# Patient Record
Sex: Male | Born: 1960 | Race: Black or African American | Hispanic: No | Marital: Single
Health system: Midwestern US, Community
[De-identification: ages and names within clinical notes are randomized; demographics above are authoritative.]

---

## 1998-05-02 ENCOUNTER — Encounter: Payer: Self-pay | Admitting: Emergency Medicine

## 1998-05-02 ENCOUNTER — Emergency Department (HOSPITAL_COMMUNITY): Admission: EM | Admit: 1998-05-02 | Discharge: 1998-05-02 | Payer: Self-pay | Admitting: Emergency Medicine

## 2002-06-18 ENCOUNTER — Emergency Department (HOSPITAL_COMMUNITY): Admission: EM | Admit: 2002-06-18 | Discharge: 2002-06-19 | Payer: Self-pay | Admitting: Internal Medicine

## 2002-06-19 ENCOUNTER — Encounter: Payer: Self-pay | Admitting: Internal Medicine

## 2002-07-14 ENCOUNTER — Emergency Department (HOSPITAL_COMMUNITY): Admission: EM | Admit: 2002-07-14 | Discharge: 2002-07-14 | Payer: Self-pay | Admitting: Emergency Medicine

## 2003-07-21 ENCOUNTER — Encounter: Payer: Self-pay | Admitting: *Deleted

## 2003-07-21 ENCOUNTER — Inpatient Hospital Stay (HOSPITAL_COMMUNITY): Admission: EM | Admit: 2003-07-21 | Discharge: 2003-07-28 | Payer: Self-pay | Admitting: Psychiatry

## 2003-09-22 ENCOUNTER — Inpatient Hospital Stay (HOSPITAL_COMMUNITY): Admission: EM | Admit: 2003-09-22 | Discharge: 2003-09-26 | Payer: Self-pay | Admitting: Psychiatry

## 2003-10-23 ENCOUNTER — Encounter: Payer: Self-pay | Admitting: *Deleted

## 2003-10-23 ENCOUNTER — Inpatient Hospital Stay (HOSPITAL_COMMUNITY): Admission: RE | Admit: 2003-10-23 | Discharge: 2003-10-26 | Payer: Self-pay | Admitting: Psychiatry

## 2003-11-27 NOTE — Unmapped (Signed)
Signed by   LinkLogic on 02/21/2004 at 14:28:37  Patient: [name]  Note: All result statuses are Final unless otherwise noted.    Tests: (1)  (MR)    Order Note:                                  [facility]     PATIENT NAME:         Troy Jennings, Troy Jennings                   MR #:  [id number]  DATE OF BIRTH:        1961/05/22                      ACCOUNT #:  [id number]  PHYSICAN:             Creig Hines, M.D.               ROOM #:  CVR  SERVICE:              Internal Medicine/Cardiology    NURSING UNIT:  UCV  DICTATED BY:          Creig Hines, M.D.               [facility]:  C  PROCEDURE DATE:       11/27/2003                      DISCHARGE DATE:                                        PROCEDURE REPORT     INDICATIONS:  The patient is a 43 year old man with limited scleroderma and  recent onset of exertional dyspnea with suspicion of pulmonary arterial  hypertension.     PROCEDURE(S) PERFORMED:       1.  Right heart catheterization via the right femoral vein.     DETAILS OF PROCEDURE:  The patient was prepped and draped in the usual  manner for femoral venous approach to right heart catheterization after  informed consent was obtained.  Local anesthesia was obtained with 2%  Xylocaine, and a small incision was made in the skin at the site of local  anesthesia.  Access to the right femoral vein was obtained using a single-  entry needle, and a Cordis sheath/introducer combination was inserted over  a wire into the right femoral vein.  The dilator was removed, and the  sheath left in place and flushed.  A 7 French Swan-Ganz catheter was  advanced through the Cordis sheath into the femoral vein and from there to  the inferior vena cava, the right atrium, the right ventricle, the  pulmonary artery, and the pulmonary artery wedge positions in that order,  and blood samples were obtained at each site in sequence for O2 saturation  determination.  Pressures measurements were made as the catheter was  advanced.  With the  catheter in the main pulmonary artery, thermodilution  cardiac output measurements were made at least triplicate.  The Swan-Ganz  catheter was then removed, and the Cordis sheath introducer was removed as  well.  Manual pressure was maintained over the puncture site for five  minutes.  Once hemostasis was obtained, the patient was removed from the  cardiac catheterization laboratory in good condition.       FINDINGS:     1.        The following intracardiac pressures were noted:  Right atrium 4,            right ventricle 23/6, pulmonary artery 23/10, pulmonary artery mean            13, pulmonary artery wedge 7.  The right atrial O2 saturation was   68%,            the pulmonary artery O2 saturation 70%.  The cardiac output was 6.7            L/min, the cardiac index 3.0 L/min/m2.  The pulmonary vascular            resistance was less than 1 Wood unit.        IMPRESSION:     1.        Normal right heart catheterization without evidence of pulmonary            arterial hypertension or left ventricular diastolic dysfunction.                                                                                                           PE/mjs  D:  12/04/2003  T:  12/04/2003     c:  Creig Hines, M.D.         [place], M.D.         [place], M.D.*       Note: An exclamation mark (!) indicates a result that was not dispersed into   the flowsheet.  Document Creation Date: 02/21/2004 2:28 PM  _______________________________________________________________________    (1) Order result status: Final  Collection or observation date-time: 11/27/2003 00:00  Requested date-time:   Receipt date-time:   Reported date-time:   Referring Physician:    Ordering Physician:  Reviewed In Hospital [facility])  Specimen Source:   Source: DBS  Filler Order Number: [id number]  Lab site:

## 2003-12-18 NOTE — Unmapped (Signed)
Signed by   LinkLogic on 12/18/2003 at 13:49:16    Appointment status changed to no show by  LinkLogic on 12/18/2003 1:49 PM.    No Show Comments  ----------------  OTHER     Appointment Information  -----------------------  Appt Type:         Date:  Monday, Dec 18, 2003       Time:  1:00 PM for 30 min    Urgency:  Routine    Made By:  Jannett Celestine   To Visit:  Jim Like MD     Reason:  OTHER     Appt Comments  -------------  -- 12/18/03 13:49: Gerrit Heck) NO SHOW --  OTHER   DR. Georgeann Oppenheim    -- 11/20/03 16:07: (POLLITJA) BOOKED --  Routine  at 12/18/2003 1:00 PM for 30 min  OTHER   DR. Georgeann Oppenheim

## 2004-02-07 NOTE — Unmapped (Signed)
Signed by   LinkLogic on 02/07/2004 at 14:44:09    Appointment status changed to no show by  LinkLogic on 02/07/2004 2:44 PM.    No Show Comments  ----------------  OTHER     Appointment Information  -----------------------  Appt Type:         Date:  Wednesday, February 07, 2004       Time:  1:45 PM for 15 min    Urgency:  Routine    Made By:  Jannett Celestine   To Visit:  Aleen Sells MD     Reason:  OTHER     Appt Comments  -------------  -- 02/07/04 14:44: (HOWARDNL) NO SHOW --  OTHER   NO TEST ORDERED    -- 11/01/03 15:46: [facility]) BOOKED --  Routine  at 02/07/2004 1:45 PM for 15 min  OTHER   NO TEST ORDERED

## 2004-04-28 ENCOUNTER — Ambulatory Visit: Payer: Self-pay | Admitting: Psychiatry

## 2004-04-29 ENCOUNTER — Inpatient Hospital Stay (HOSPITAL_COMMUNITY): Admission: EM | Admit: 2004-04-29 | Discharge: 2004-05-03 | Payer: Self-pay | Admitting: Psychiatry

## 2004-05-04 ENCOUNTER — Emergency Department (HOSPITAL_COMMUNITY): Admission: EM | Admit: 2004-05-04 | Discharge: 2004-05-04 | Payer: Self-pay | Admitting: Emergency Medicine

## 2004-12-15 ENCOUNTER — Emergency Department (HOSPITAL_COMMUNITY): Admission: EM | Admit: 2004-12-15 | Discharge: 2004-12-15 | Payer: Self-pay | Admitting: *Deleted

## 2005-01-04 IMAGING — CT CT HEAD W/O CM
1 series · 16 of 30 positions shown, 20 images · non-contrast
Comparison: none

CLINICAL DATA: Headache.  Dizziness.

[Series 2: head routi 5.0 h30s · axial · 0.43mm/px · z∈[-652,-518]mm · 16 of 30 slices shown, 20 images]
[im 2/30  brain]
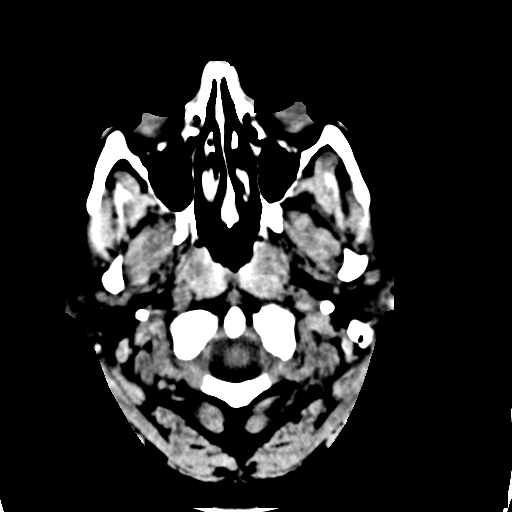
[im 2/30  bone]
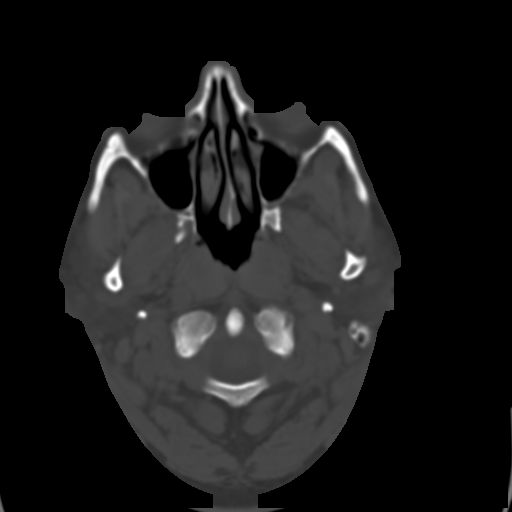
[im 4/30  brain]
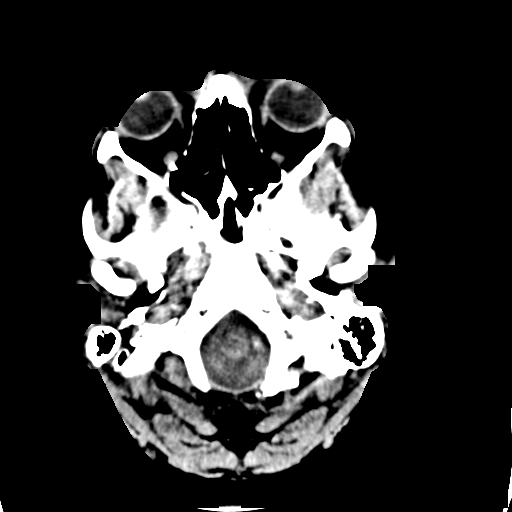
[im 6/30  brain]
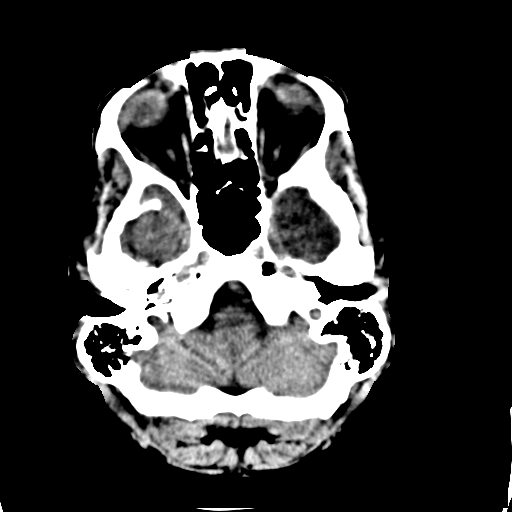
[im 8/30  brain]
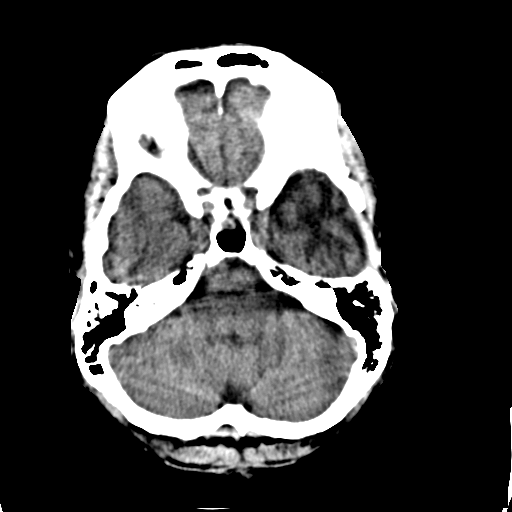
[im 9/30  brain]
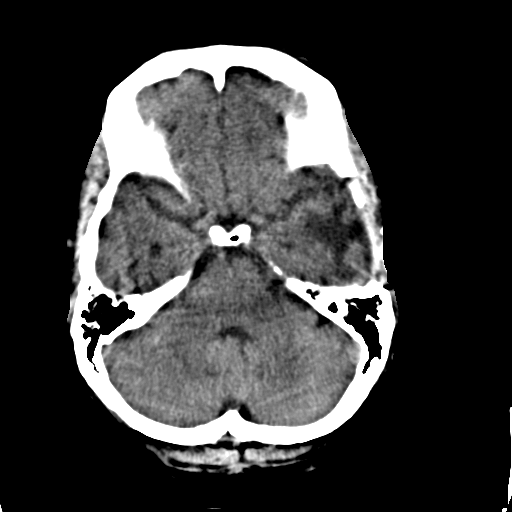
[im 9/30  bone]
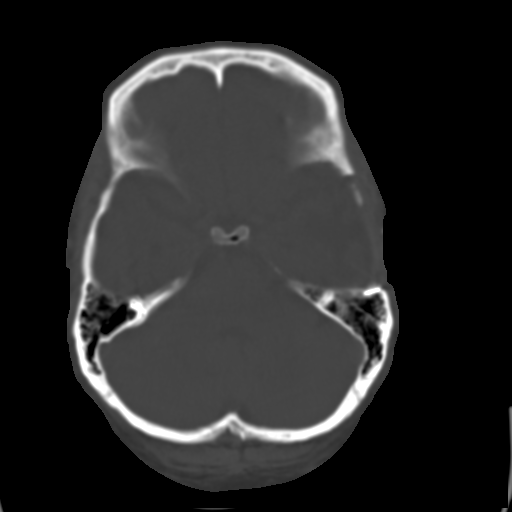
[im 11/30  brain]
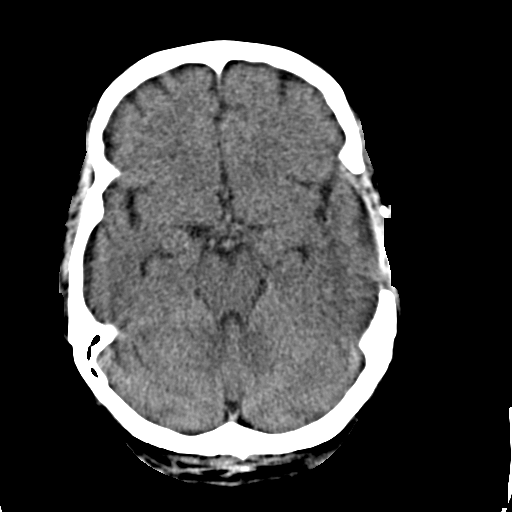
[im 13/30  brain]
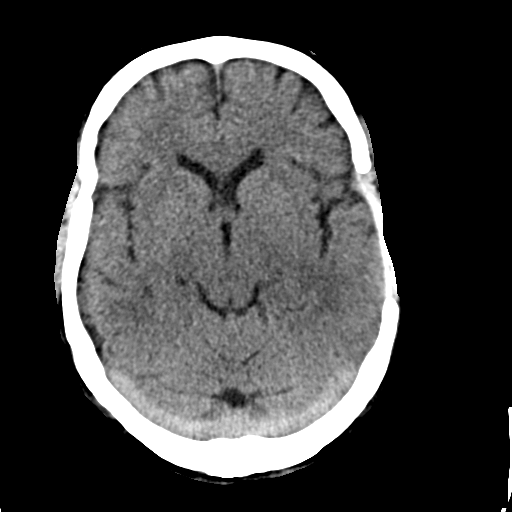
[im 15/30  brain]
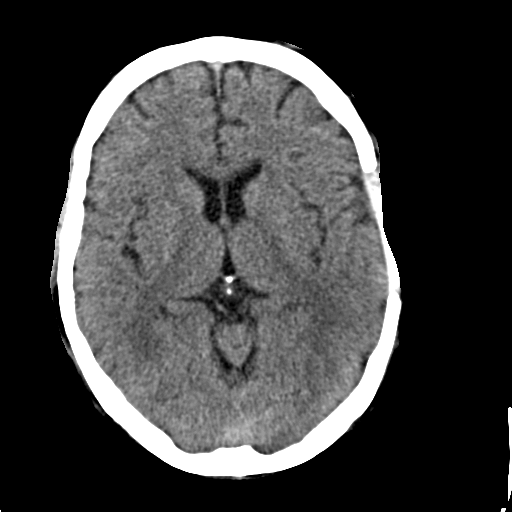
[im 16/30  brain]
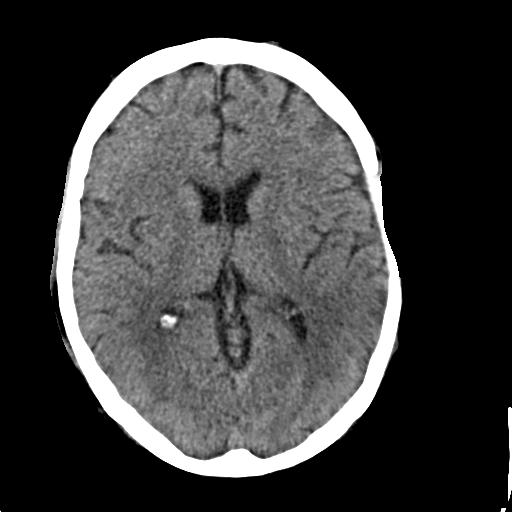
[im 16/30  bone]
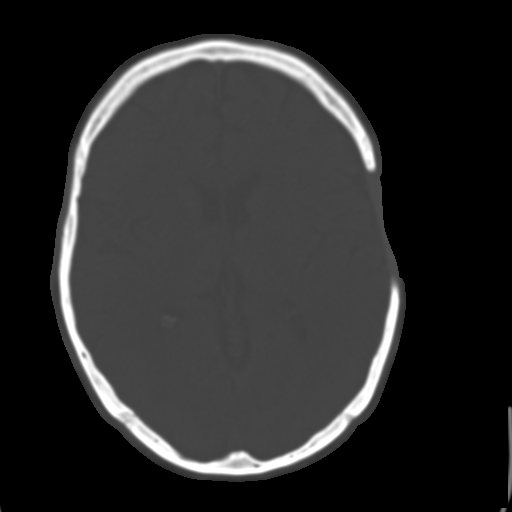
[im 18/30  brain]
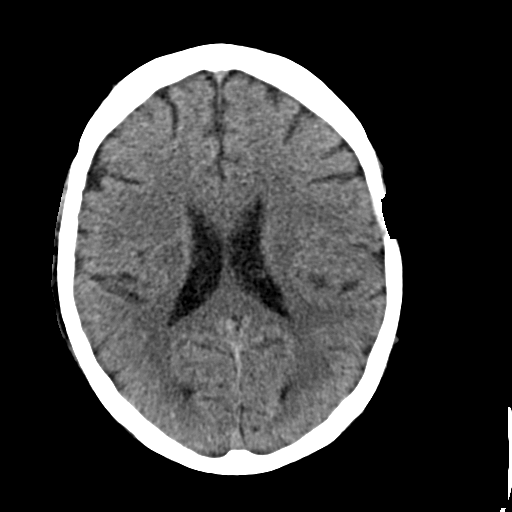
[im 20/30  brain]
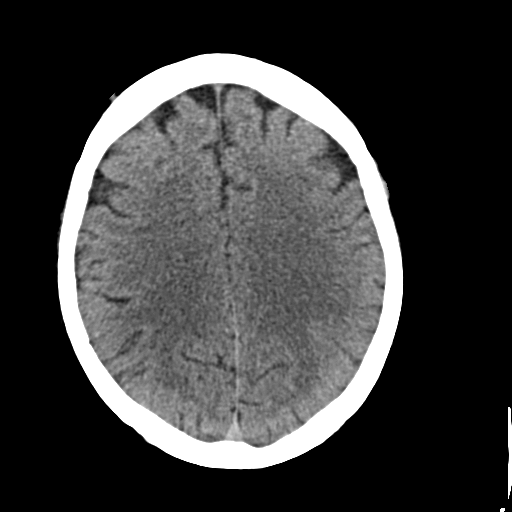
[im 22/30  brain]
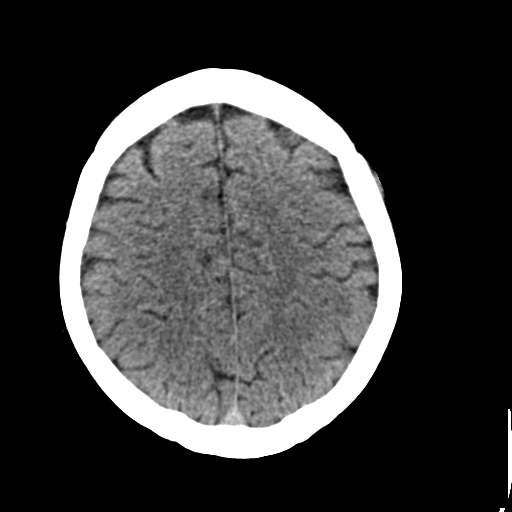
[im 23/30  brain]
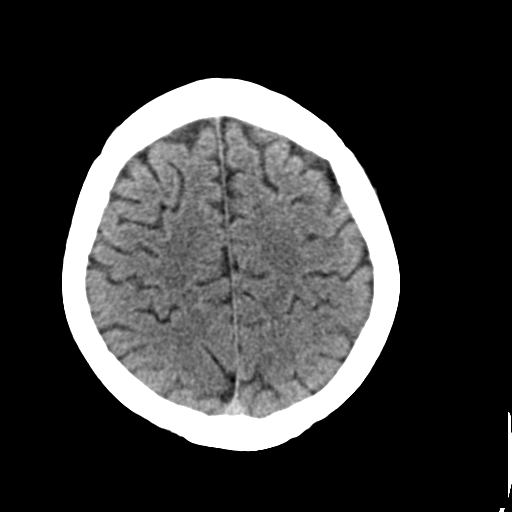
[im 23/30  bone]
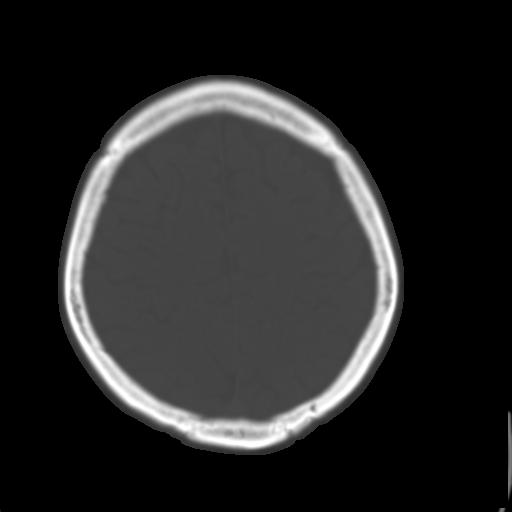
[im 25/30  brain]
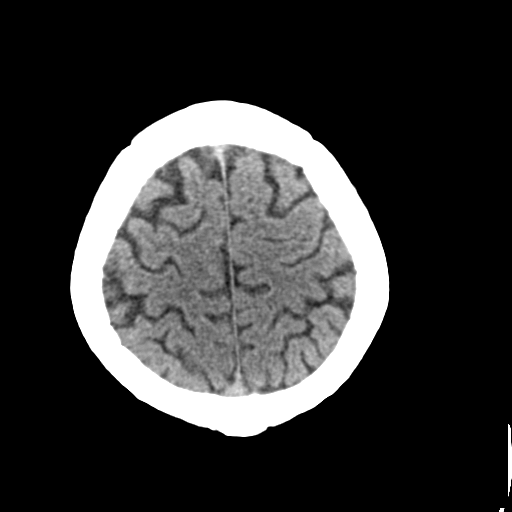
[im 27/30  brain]
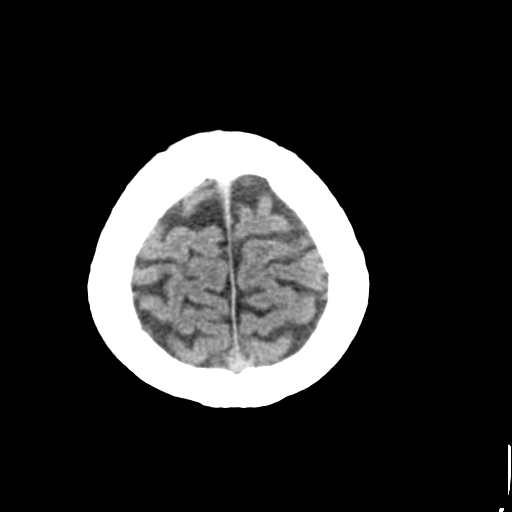
[im 29/30  brain]
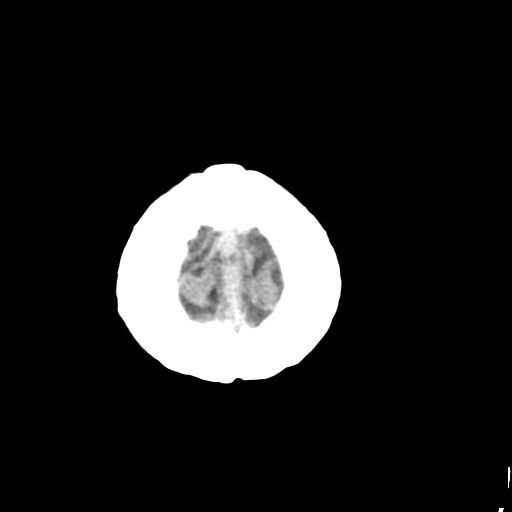

[16 of 30 positions shown; findings below may reference images not displayed]

CT OF THE HEAD WITHOUT CONTRAST ? 07/21/03
 No prior studies are currently available for comparison.  

 There has been a previous left temporal craniectomy and there is encephalomalacia seen in the left temporal lobe region.  There are no midline shifts or mass effects.  There is no CT scan evidence for recent stroke or hemorrhage.  The ventricles are normal in size and contour.  There are no extra-axial fluid collections.  The paranasal sinuses as visualized appear normally aerated.

 IMPRESSION
 Previous left temporal craniectomy with encephalomalacia associated with the left temporal lobe.  No acute intracranial abnormalities.

 [REDACTED]

## 2005-04-08 IMAGING — CT CT HEAD W/O CM
1 series · 16 of 30 positions shown, 20 images · non-contrast
Comparison: Compared with a prior examination of 07/21/03.

CLINICAL DATA: Medical clearance.  Patient with headache, blurred vision, and balance difficulty.
 CT BRAIN WITHOUT CONTRAST

[Series 2: head routi 5.0 h30s · axial · 0.43mm/px · z∈[+1285,+1425]mm · 16 of 32 slices shown, 20 images]
[im 2/32  brain]
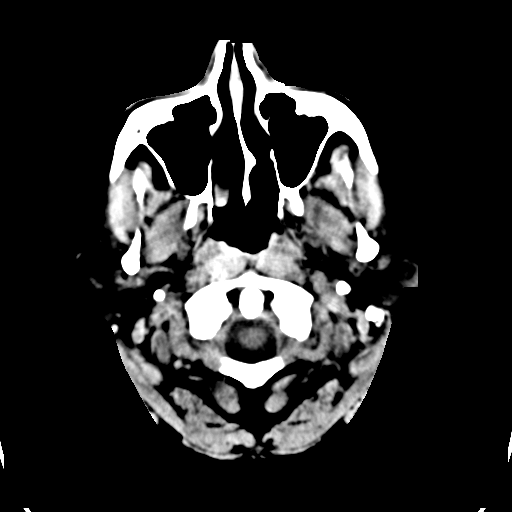
[im 2/32  bone]
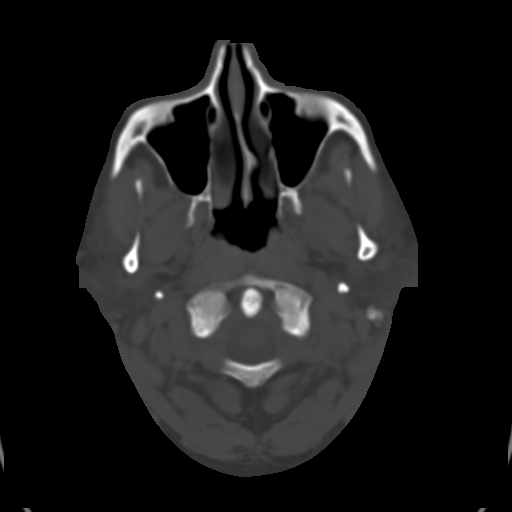
[im 4/32  brain]
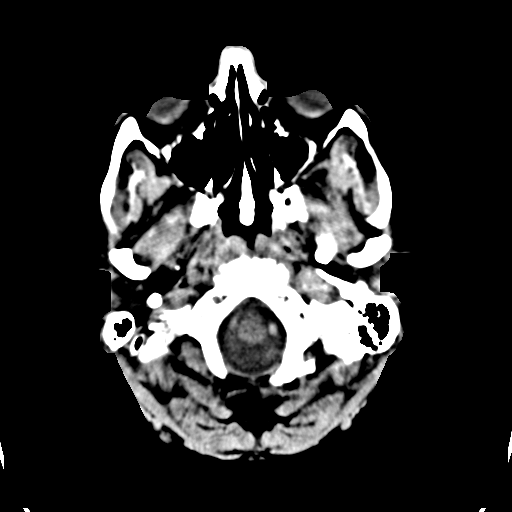
[im 6/32  brain]
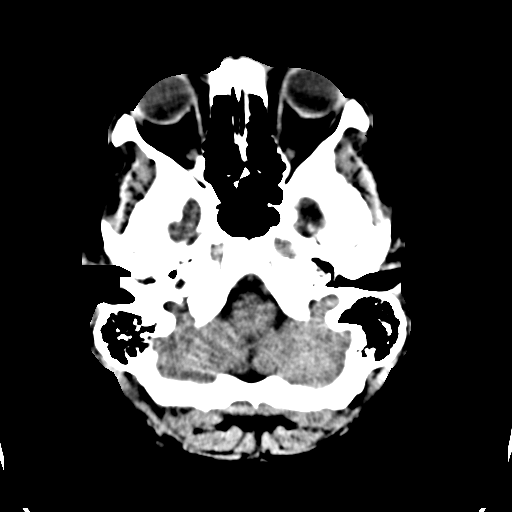
[im 8/32  brain]
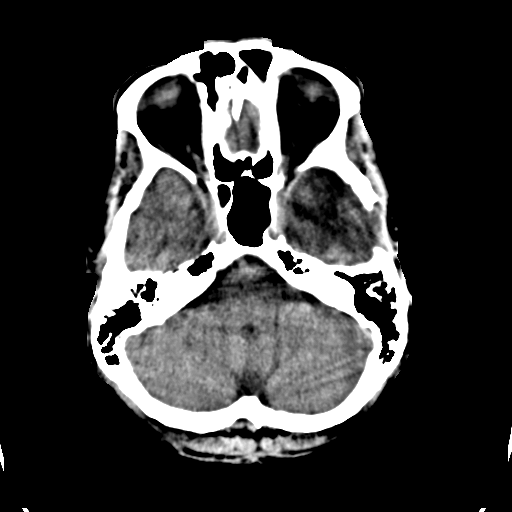
[im 9/32  brain]
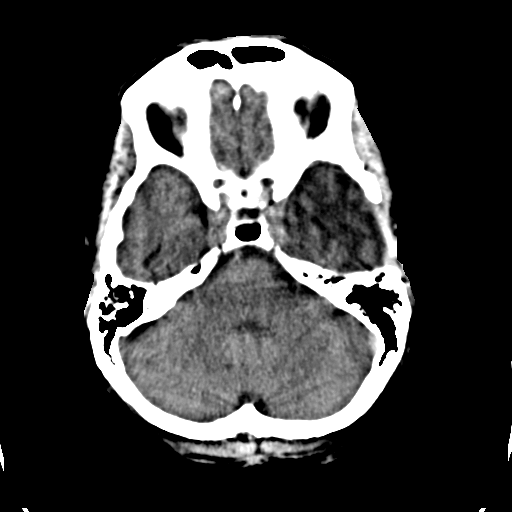
[im 9/32  bone]
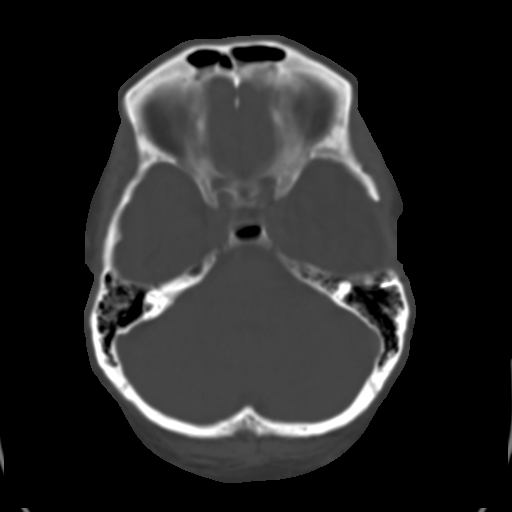
[im 11/32  brain]
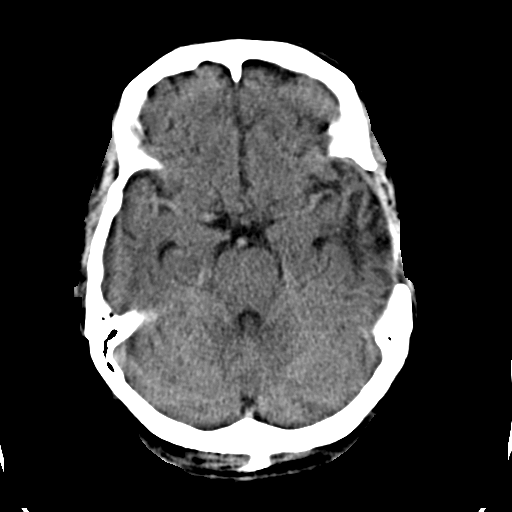
[im 13/32  brain]
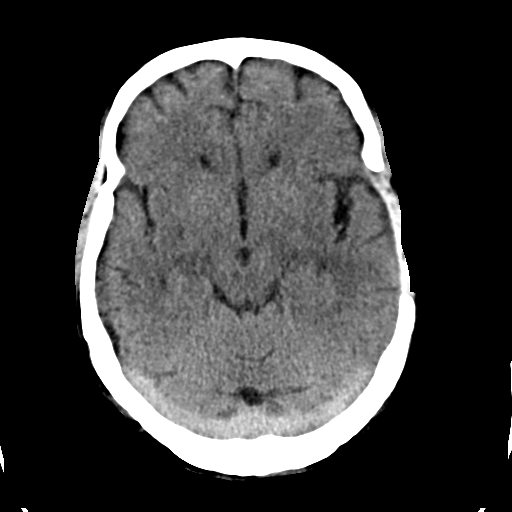
[im 15/32  brain]
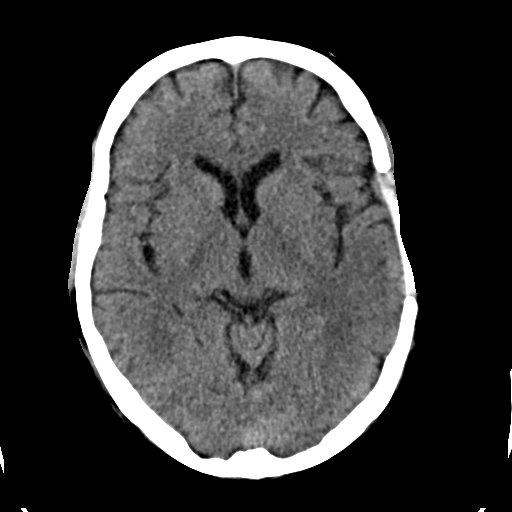
[im 17/32  brain]
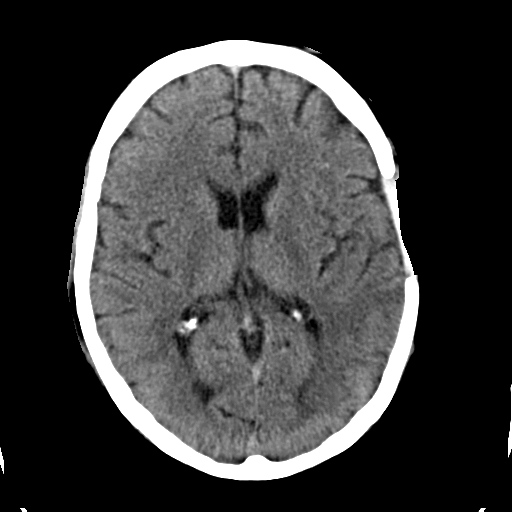
[im 17/32  bone]
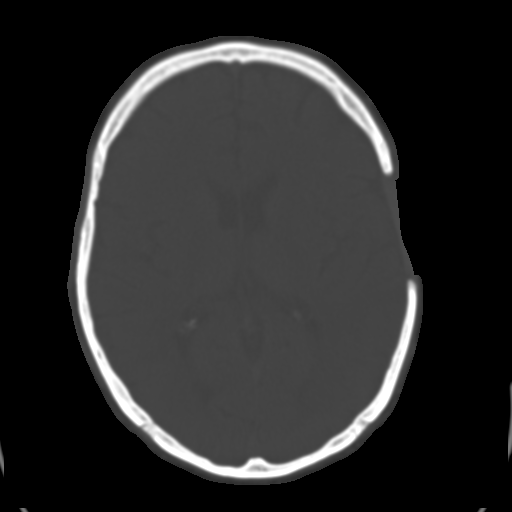
[im 19/32  brain]
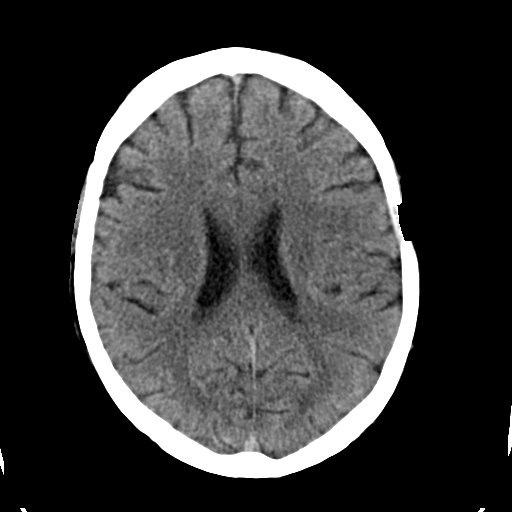
[im 21/32  brain]
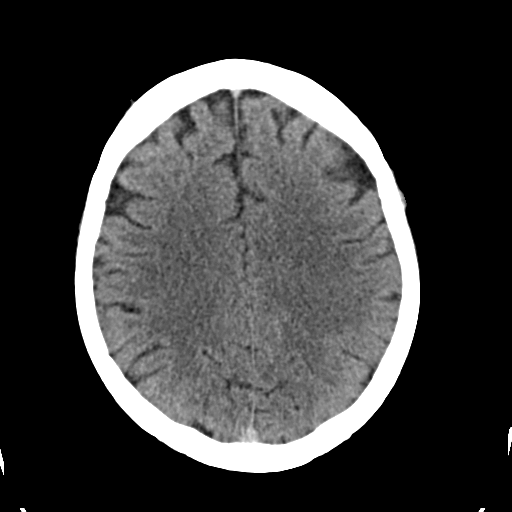
[im 23/32  brain]
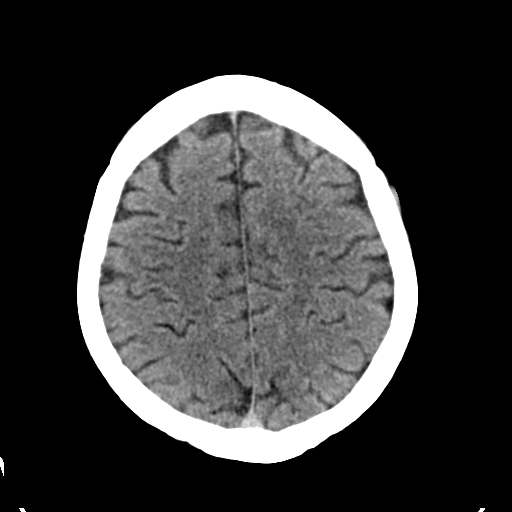
[im 24/32  brain]
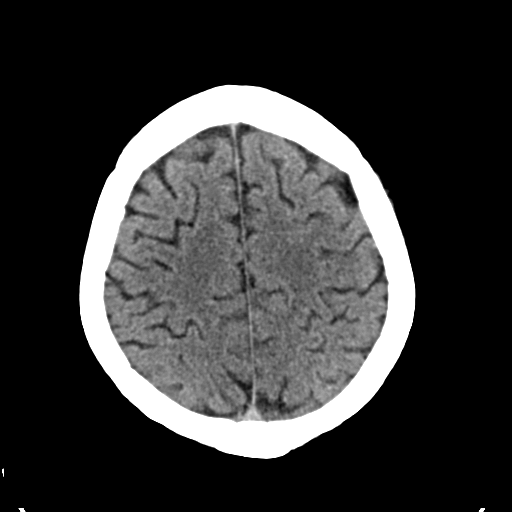
[im 24/32  bone]
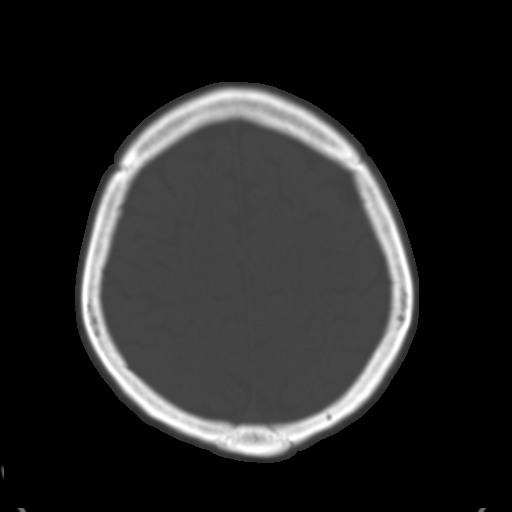
[im 26/32  brain]
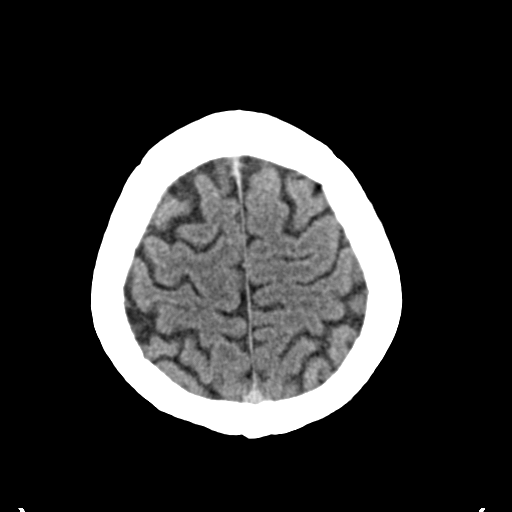
[im 28/32  brain]
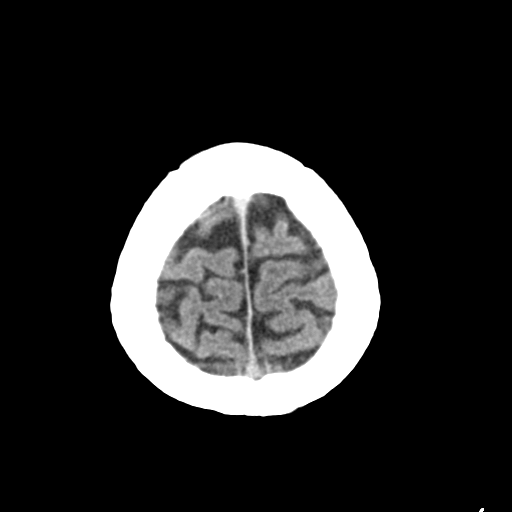
[im 30/32  brain]
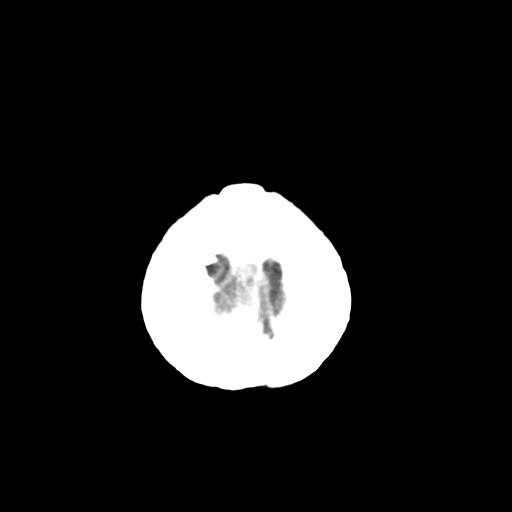

[16 of 30 positions shown; findings below may reference images not displayed]

The patient has had a previous left temporal craniectomy.  There is encephalomalacia of the anterior aspect of the left temporal lobe.  There is no intracerebral mass effect or hemorrhage.  Mild atrophy is noted.  
 IMPRESSION
 1.  No acute intracranial abnormality.
 2.  Previous left temporal craniectomy with encephalomalacia of the left temporal lobe as demonstrated on the prior exam dated 07/21/03.  
 3.  Mild atrophy.

## 2006-04-03 ENCOUNTER — Inpatient Hospital Stay

## 2006-04-03 NOTE — Unmapped (Signed)
Signed by   LinkLogic on 04/06/2006 at 14:19:15  Patient: Troy Jennings  Note: All result statuses are Final unless otherwise noted.    Tests: (1) ANG-FLUORO GUIDE CEN VEN S&I ([id number])    Order NotePricilla Handler Order Number: [id number]    Non-EMR Ordering Provider: Rondel Hiddenite     Order Note:     *** VERIFIED [facility]  Reason:  SEPSIS  Dict.Staff: Donnie Coffin [phone]  Dict.Res: PAIK (FELLOW), EUGENE PAIK  Verified By: Donnie Coffin             Ver: 04/06/06   2:19 pm  Exams:  ANG-US GUIDE NEEDLE PLACE S&I  ANG-PICC WO PORT/PUMP >26YRS LT    PICC PLACEMENT PERFORMED 04/03/2006    INDICATION: Sepsis    OPERATORS: Dr. Renold Don, attending; Dr. Audie Box, fellow    DESCRIPTION: The potential risks and benefits of the procedure  were discussed with the patient and written consent was obtained.    The patient's left arm was prepped and draped in sterile  fashion. The left basilic vein was localized with ultrasound.  Following infiltration with 1% lidocaine, the left basilic vein  was accessed under direct ultrasound guidance. A guidewire was  freely advanced to the needle in the needle was removed. A  sheath and dilator were placed over the wire into the vein. The  dilator and wire were removed and a 5 French dual-lumen Arrow  PICC was advanced through the sheath with the tip positioned at  the cavoatrial junction. The catheter was locked with heparin.  The catheter was sutured into position. A dry, sterile dressing  was applied.    The patient tolerated the procedure well and there were no  immediate complications.    FINDINGS: Single spot radiograph demonstrates a 5 French  dual-lumen ARROW PICC with the tip positioned at the cavoatrial  junction.    IMPRESSION:    DUAL-LUMEN PICC PLACEMENT, AS DETAILED ABOVE  **** end of result ****    Note: An exclamation mark (!) indicates a result that was not dispersed into   the flowsheet.  Document Creation Date: 04/06/2006 2:19  PM  _______________________________________________________________________    (1) Order result status: Final  Collection or observation date-time: 04/03/2006 13:51:00  Requested date-time: 04/03/2006 13:51:00  Receipt date-time:   Reported date-time: 04/06/2006 14:19:12  Referring Physician: Trudee Grip  Ordering Physician:  Non-EMR Physician [facility])  Specimen Source:   Source: QRS  Filler Order Number: [id number]  Lab site: [facility]

## 2006-04-03 NOTE — Unmapped (Signed)
Signed by   LinkLogic on 04/06/2006 at 14:19:15  Patient: Troy Jennings  Note: All result statuses are Final unless otherwise noted.    Tests: (1) ANG-PICC WO PORT/PUMP >38YRS LT ([id number])    Order NotePricilla Handler Order Number: [id number]    Non-EMR Ordering Provider: Rondel [place]ton     Order Note:     *** VERIFIED [facility]  Reason:  SEPSIS  Dict.Staff: Donnie Coffin [phone]  Dict.Res: PAIK (FELLOW), EUGENE PAIK  Verified By: Donnie Coffin             Ver: 04/06/06   2:19 pm  Exams:  ANG-US GUIDE NEEDLE PLACE S&I  ANG-PICC WO PORT/PUMP >38YRS LT    PICC PLACEMENT PERFORMED 04/03/2006    INDICATION: Sepsis    OPERATORS: Dr. Renold Don, attending; Dr. Audie Box, fellow    DESCRIPTION: The potential risks and benefits of the procedure  were discussed with the patient and written consent was obtained.    The patient's left arm was prepped and draped in sterile  fashion. The left basilic vein was localized with ultrasound.  Following infiltration with 1% lidocaine, the left basilic vein  was accessed under direct ultrasound guidance. A guidewire was  freely advanced to the needle in the needle was removed. A  sheath and dilator were placed over the wire into the vein. The  dilator and wire were removed and a 5 French dual-lumen Arrow  PICC was advanced through the sheath with the tip positioned at  the cavoatrial junction. The catheter was locked with heparin.  The catheter was sutured into position. A dry, sterile dressing  was applied.    The patient tolerated the procedure well and there were no  immediate complications.    FINDINGS: Single spot radiograph demonstrates a 5 French  dual-lumen ARROW PICC with the tip positioned at the cavoatrial  junction.    IMPRESSION:    DUAL-LUMEN PICC PLACEMENT, AS DETAILED ABOVE  **** end of result ****    Note: An exclamation mark (!) indicates a result that was not dispersed into   the flowsheet.  Document Creation Date: 04/06/2006 2:19  PM  _______________________________________________________________________    (1) Order result status: Final  Collection or observation date-time: 04/03/2006 14:59:00  Requested date-time: 04/03/2006 13:51:00  Receipt date-time:   Reported date-time: 04/06/2006 14:19:12  Referring Physician: Trudee Grip  Ordering Physician:  Non-EMR Physician [facility])  Specimen Source:   Source: QRS  Filler Order Number: [id number]  Lab site: [facility]

## 2006-04-03 NOTE — Unmapped (Signed)
Signed by   LinkLogic on 04/06/2006 at 14:19:16  Patient: Troy Jennings  Note: All result statuses are Final unless otherwise noted.    Tests: (1) ANG-US GUIDE NEEDLE PLACE S&I ([id number])    Order NotePricilla Handler Order Number: [id number]    Non-EMR Ordering Provider: Rondel [place]     Order Note:     *** VERIFIED [facility]  Reason:  SEPSIS  Dict.Staff: Donnie Coffin [phone]  Dict.Res: PAIK (FELLOW), EUGENE PAIK  Verified By: Donnie Coffin             Ver: 04/06/06   2:19 pm  Exams:  ANG-US GUIDE NEEDLE PLACE S&I  ANG-PICC WO PORT/PUMP >19YRS LT    PICC PLACEMENT PERFORMED 04/03/2006    INDICATION: Sepsis    OPERATORS: Dr. Renold Don, attending; Dr. Audie Box, fellow    DESCRIPTION: The potential risks and benefits of the procedure  were discussed with the patient and written consent was obtained.    The patient's left arm was prepped and draped in sterile  fashion. The left basilic vein was localized with ultrasound.  Following infiltration with 1% lidocaine, the left basilic vein  was accessed under direct ultrasound guidance. A guidewire was  freely advanced to the needle in the needle was removed. A  sheath and dilator were placed over the wire into the vein. The  dilator and wire were removed and a 5 French dual-lumen Arrow  PICC was advanced through the sheath with the tip positioned at  the cavoatrial junction. The catheter was locked with heparin.  The catheter was sutured into position. A dry, sterile dressing  was applied.    The patient tolerated the procedure well and there were no  immediate complications.    FINDINGS: Single spot radiograph demonstrates a 5 French  dual-lumen ARROW PICC with the tip positioned at the cavoatrial  junction.    IMPRESSION:    DUAL-LUMEN PICC PLACEMENT, AS DETAILED ABOVE  **** end of result ****    Note: An exclamation mark (!) indicates a result that was not dispersed into   the flowsheet.  Document Creation Date: 04/06/2006 2:19  PM  _______________________________________________________________________    (1) Order result status: Final  Collection or observation date-time: 04/03/2006 13:51:00  Requested date-time: 04/03/2006 13:51:00  Receipt date-time:   Reported date-time: 04/06/2006 14:19:12  Referring Physician: Trudee Grip  Ordering Physician:  Non-EMR Physician [facility])  Specimen Source:   Source: QRS  Filler Order Number: [id number]  Lab site: [facility]

## 2013-12-14 ENCOUNTER — Encounter: Admit: 2013-12-14 | Discharge: 2013-12-14

## 2013-12-14 DIAGNOSIS — M3589 Other specified systemic involvement of connective tissue (CMS-HCC): Secondary | ICD-10-CM

## 2013-12-14 NOTE — Unmapped (Signed)
Diagnosis:   1. Other specified diffuse disease of connective tissue            Referring Provider:Laureno-Alvarez, Royal Piedra, *    Insurance plan:   Payor/Plan Subscr DOB Sex Relation Sub. Ins. ID Effective Group Num   1. [employer] [name] 29-Mar-1961 Male  [id number] 11/01/13                                                             # of visits per insurance authorization:12 V  # of visits per POC: 12     Date of Initial Eval: 12/14/2013   Date of last POC:12/14/2013/    Goals of Therapy:     Problem list   Impairments/functional deficits  Goals (to be met by 02/07/2014):    1 Patient complains pain of 3-10/10 in both hips and low back  Decreased pain by 30-50%    2 Decreased Abdominal strength -- poor  Improved Abdominal strength to good    3 Decreased strength Both hips 3-/5, quads 4-/5  Improved lE's strength by one muscle grade    4 Dependent with home exercise program  Patient is independent in home exercise program in warm water with transition to community pool    5 Ambulation -- poor quality of gait, 3-5 min  Improved walking tolerance to 15 min    6 Standing tolerance with activities 10 min  Improved standing tolerance to 20-30 min          _____________________________________________________________________  Pain:4/10 Location:hips., low back    Precautions:  scleroderma    Subjective:     Objective: PT treatment per log:    Activity Date: 12/14/2013 Date:  Date: Date:    Visit # 1       evaluation  completed       education   X 10 min                                                              Total Treatment Time  40 min      Timed Code Treatment Minutes  30 min eval   10 min TE        Education Addressed During This Session:POC,posture/body mechanics,self care  Strategies, pool info /tour    Assessment/Progress toward Goals: Evaluation completed. Refer to full report for POC and goals status.      Plan: 2 times/ week for 6 weeks. in warm water   for posture/education, body mechanics   MOC,  therapeutic ex's, HEP   Trunk strengthening /stabilization   LE's strengthening   Self care strategies

## 2013-12-14 NOTE — Unmapped (Signed)
Physical Therapy Evaluation Form     Date of Evaluation: 12/14/2013  Referring MD: Cathren Harsh, *  Primary Dx:   1. Other specified diffuse disease of connective tissue      Onset Date:     08/11/2013                           Date of surgery: left THR 05/02/2013  PMH: See Medical History.  Medication Reviewed: See Medication List.           Subjective Information    Chief Complaint:  HX of bilateral hips pain and increased stiffness, has been getting worse. Scheduled for R THR on 03/08/2014    Mechanism of Injury:  arthritis    Diagnostic Tests/Results:  X-RAY    Precautions:     Previous Treatments: No recent PT    Pain:  Location :    Bilateral hips pain, >on the right       Intermittent__              Constant_x_      Pain level    __3-10__/10                 -Aggravating Factors: walking   -Relieving Factors:rest      Previous LOF: walking tolerance WFL after left THR  Current   LOF: limited walking tolerance due to right hip and low back pain    Functional tolerance:    Sitting __            Walking_3-5 min_     Standing_10 min_         ADL's (UE's) :    Patient Goal for Therapy: improve LE's strength and mobility    Comments:                                                                               Objective information                                                                                                   Posture: right trunk shift, left weight shift, forward flexion                       Sensation: WNL                     Edema: none                                          Palpation : LS paraspinals  __x LE's         Dominant: R           UE's ROM   R      L MMT  R     L      LE's        WFL     Shoulder Flexion           3-/5    3/5  Hip  flexion   Shoulder Abduction           3-/5    3/5         abduction   Shoulder Extension           extension   Shoulder IR            IR   Shoulder ER             ER   Elbow Flexion               Knee flexion   Elbow Extension           4-/5    4-/5            extension   Wrist flexion            5/5    5/5  Ankle DF   Wrist extension                PF   Wrist supination               INV   Wrist pronation               EV                                         Special Tests :   SLR's with DF (-)   Abdominals-- poor    Ambulation/Gait: Antalgic gait, slow speed, decreased stance on the left    Transfers:  Sit  <--->Stand :      Comments:                                                                                                                                  Assessment                Problem list        Impairments/functional deficits           Goals (to be met by 02/07/2014):        1   Patient complains pain of 3-10/10 in both hips and low back    Decreased pain by 30-50%      2   Decreased  Abdominal strength -- poor  Improved  Abdominal strength to good      3   Decreased strength  Both hips 3-/5, quads 4-/5  Improved lE's strength by one muscle grade      4   Dependent with home exercise program   Patient is independent in home exercise program in warm water with transition to community pool      5   Ambulation -- poor quality of gait, 3-5 min  Improved walking tolerance to  15 min      6  Standing tolerance with activities 10 min  Improved standing tolerance to 20-30 min            Assessment/Recommendation for skilled PT:  Patient presents with  Increased hips/low back pain, LE's weakness, poor quality of gait, decreased trunk strength/stabilty contributing to functional limitations including limited walking and standing tolerance . Patient would benefit from water skilled PT to address the aforementioned deficits.         Rehabilitation Potential: Based on this therapist's assessment, Jasyah Theurer has the following rehabilitation potential for the PT goals stated below: : good           Treatment Plan for Physical Therapy:    2 times/ week for 6 weeks. in warm  water   for   posture/education, body mechanics    MOC, therapeutic ex's, HEP   Trunk strengthening /stabilization   LE's strengthening   Self care strategies                                                                Therapist Name:Evan Mackie P Johnpatrick Jenny               Date:12/14/2013           Physical Therapist    Physician Certification  I certify that the above patient is under my care an requires the above services. These professional services are to be provided from an established plan, related to the diagnosis and reviewed by me every 90 days.     Additional comments/revisions:        Signature____________________________________________ Date: _____________  Physician Name (printed):

## 2013-12-19 ENCOUNTER — Encounter

## 2013-12-21 ENCOUNTER — Encounter: Admit: 2013-12-21 | Discharge: 2013-12-21

## 2013-12-21 DIAGNOSIS — M3589 Other specified systemic involvement of connective tissue (CMS-HCC): Secondary | ICD-10-CM

## 2013-12-21 NOTE — Unmapped (Signed)
Diagnosis:   No diagnosis found.        Referring Provider:Laureno-Alvarez, Royal Piedra, *    Insurance plan:   Payor/Plan Subscr DOB Sex Relation Sub. Ins. ID Effective Group Num   1. [facility], [place] May 01, 1961 Male  [id number] 11/01/13                                                             # of visits per insurance authorization:12 V  # of visits per POC: 12     Date of Initial Eval: 12/14/2013   Date of last POC:12/14/2013/    Goals of Therapy:     Problem list   Impairments/functional deficits  Goals (to be met by 02/07/2014):    1 Patient complains pain of 3-10/10 in both hips and low back  Decreased pain by 30-50%    2 Decreased Abdominal strength -- poor  Improved Abdominal strength to good    3 Decreased strength Both hips 3-/5, quads 4-/5  Improved lE's strength by one muscle grade    4 Dependent with home exercise program  Patient is independent in home exercise program in warm water with transition to community pool    5 Ambulation -- poor quality of gait, 3-5 min  Improved walking tolerance to 15 min    6 Standing tolerance with activities 10 min  Improved standing tolerance to 20-30 min          _____________________________________________________________________  Pain:5/10 Location:hips., low back    Precautions:  scleroderma    Subjective: Pt states having pain in lower back and R hip.  Pain mostly when walking.  Pt states he has been doing his own UE exercise program at home with 10# weights.    Objective: PT treatment per log:in pool with therapist on deck    Activity Date: 12/14/2013 Date: 12/21/2013 Date: Date:    Visit # 1 2      evaluation  completed pool      education   X 10 min      Laps f/b/s  2 laps each     HS stretch  1 x 20 BLE     Gastroc stretch  2 x 20 BLE     SKTC  2 x 20 BLE     Marches  x10 BLE     Hip 3 way  x10 each way     Open gate  x10 BLE                                               Bike w/noodle  2 min     Total Treatment Time  40 min 43 min     Timed Code Treatment  Minutes  30 min eval   10 min TE 43 min aq ex 1:1       Education Addressed During This Session:Educated pt in proper body mechanics and technique during exercises.  Mod verbal cues for proper core strengthening technique.    Assessment/Progress toward Goals: Pt required mod cues in posture, breathing and core during exercises.  Pt had one spasm in his R hip during HS stretch, had pt rest so spasm  could subside.  During session pt reported pain level of 7/10 but decreased to 6/10 at end of session.         Plan: 2 times/ week for 6 weeks. in warm water   for posture/education, body mechanics   MOC, therapeutic ex's, HEP   Trunk strengthening /stabilization   LE's strengthening   Self care strategies     Tyna Jaksch, Student PTA  I have read and agree with findings of documentation  Cleora Fleet PTA

## 2013-12-26 ENCOUNTER — Encounter: Admit: 2013-12-26 | Discharge: 2013-12-26

## 2013-12-26 DIAGNOSIS — M3589 Other specified systemic involvement of connective tissue (CMS-HCC): Secondary | ICD-10-CM

## 2013-12-26 NOTE — Unmapped (Signed)
Diagnosis:   No diagnosis found.        Referring Provider:Laureno-Alvarez, Royal Piedra, *    Insurance plan:   Payor/Plan Subscr DOB Sex Relation Sub. Ins. ID Effective Group Num   1. [facility], MASE [birth year] Male  [id number] 11/01/13                                                             # of visits per insurance authorization:12 V  # of visits per POC: 12     Date of Initial Eval: 12/14/2013   Date of last POC:12/14/2013/    Goals of Therapy:     Problem list   Impairments/functional deficits  Goals (to be met by 02/07/2014):    1 Patient complains pain of 3-10/10 in both hips and low back  Decreased pain by 30-50%    2 Decreased Abdominal strength -- poor  Improved Abdominal strength to good    3 Decreased strength Both hips 3-/5, quads 4-/5  Improved lE's strength by one muscle grade    4 Dependent with home exercise program  Patient is independent in home exercise program in warm water with transition to community pool    5 Ambulation -- poor quality of gait, 3-5 min  Improved walking tolerance to 15 min    6 Standing tolerance with activities 10 min  Improved standing tolerance to 20-30 min          _____________________________________________________________________  Pain:5/10 Location:hips., low back  2/ shoulders  Precautions:  scleroderma    Subjective: Pt states  Having increased pain since first therapy.  Especially right hip  Objective: PT treatment per log:in pool with therapist on deck    Activity Date: 12/14/2013 Date: 12/21/2013 Date: 5.18.15 Date:    Visit # 1 2 3      evaluation  completed pool pool     education   X 10 min      Laps f/b/s  2 laps each 3x ea    HS stretch  1 x 20 BLE     Gastroc stretch  2 x 20 BLE     SKTC  2 x 20 BLE     Marches  x10 BLE In place with DBs  5 min    Hip 3 way  x10 each way 20x ea  Cues for core    Open gate  x10 BLE 15x           UE PREs  For core and UE strength  mulitple directison   Core stab and cues to relax UTs  20x    Alt HS curl   20x ea                            Bike w/noodle  2 min     Total Treatment Time  40 min 43 min 43 min    Timed Code Treatment Minutes  30 min eval   10 min TE 43 min aq ex 1:1 23 aq ex 1;1        Education Addressed During This Session   Work on posture  Assessment/Progress toward Goals:  tol session well but needed cues for core stab and decreased guarding in shoulders and UTs    Plan:  cont POC

## 2013-12-29 ENCOUNTER — Encounter

## 2014-01-04 ENCOUNTER — Encounter

## 2014-01-09 ENCOUNTER — Encounter

## 2014-01-11 ENCOUNTER — Encounter

## 2014-01-16 ENCOUNTER — Encounter

## 2014-01-16 NOTE — Unmapped (Signed)
Patient is no show today. Called the patient regarding attendance. Patient states he has been having health issues and his Doctor would like him do discontinue PT at this time. He said he was going to call and cancel all appointments on 01/13/2014 and apologized for not calling.  D/C per MD's orders and attendance

## 2014-01-18 ENCOUNTER — Encounter

## 2014-01-23 ENCOUNTER — Encounter

## 2014-01-25 ENCOUNTER — Encounter

## 2016-01-24 ENCOUNTER — Telehealth: Payer: Self-pay | Admitting: Allergy and Immunology

## 2016-04-09 NOTE — Telephone Encounter (Signed)
Made in error

## 2020-11-25 ENCOUNTER — Inpatient Hospital Stay
Admit: 2020-11-25 | Discharge: 2020-11-26 | Disposition: A | Discharge status: Home / Self Care | Payer: BLUE CROSS/BLUE SHIELD | Attending: Emergency Medicine

## 2020-11-25 ENCOUNTER — Emergency Department: Admit: 2020-11-25 | Payer: BLUE CROSS/BLUE SHIELD

## 2020-11-25 DIAGNOSIS — R002 Palpitations: Secondary | ICD-10-CM

## 2020-11-25 LAB — CBC WITH AUTO DIFFERENTIAL
Basophils %: 0.3 %
Basophils Absolute: 0 10*3/uL (ref 0.0–0.2)
Eosinophils %: 2.4 %
Eosinophils Absolute: 0.1 10*3/uL (ref 0.0–0.6)
Hematocrit: 42.4 % (ref 40.5–52.5)
Hemoglobin: 13.6 g/dL (ref 13.5–17.5)
Lymphocytes %: 38.4 %
Lymphocytes Absolute: 2 10*3/uL (ref 1.0–5.1)
MCH: 28.1 pg (ref 26.0–34.0)
MCHC: 32 g/dL (ref 31.0–36.0)
MCV: 87.8 fL (ref 80.0–100.0)
MPV: 8.1 fL (ref 5.0–10.5)
Monocytes %: 11.8 %
Monocytes Absolute: 0.6 10*3/uL (ref 0.0–1.3)
Neutrophils %: 47.1 %
Neutrophils Absolute: 2.5 10*3/uL (ref 1.7–7.7)
Platelets: 233 10*3/uL (ref 135–450)
RBC: 4.83 M/uL (ref 4.20–5.90)
RDW: 14.1 % (ref 12.4–15.4)
WBC: 5.3 10*3/uL (ref 4.0–11.0)

## 2020-11-25 LAB — COMPREHENSIVE METABOLIC PANEL
ALT: 18 U/L (ref 10–40)
AST: 27 U/L (ref 15–37)
Albumin/Globulin Ratio: 1.8 (ref 1.1–2.2)
Albumin: 4.8 g/dL (ref 3.4–5.0)
Alkaline Phosphatase: 94 U/L (ref 40–129)
Anion Gap: 13 (ref 3–16)
BUN: 12 mg/dL (ref 7–20)
CO2: 20 mmol/L — ABNORMAL LOW (ref 21–32)
Calcium: 9.9 mg/dL (ref 8.3–10.6)
Chloride: 104 mmol/L (ref 99–110)
Creatinine: 0.9 mg/dL (ref 0.9–1.3)
GFR African American: >60 (ref >60)
GFR Non-African American: >60 (ref >60)
Glucose: 88 mg/dL (ref 70–99)
Potassium: 4.3 mmol/L (ref 3.5–5.1)
Sodium: 137 mmol/L (ref 136–145)
Total Bilirubin: 0.7 mg/dL (ref 0.0–1.0)
Total Protein: 7.5 g/dL (ref 6.4–8.2)

## 2020-11-25 LAB — TROPONIN: Troponin: <0.01 ng/mL (ref <0.01)

## 2020-11-25 NOTE — ED Provider Notes (Signed)
[facility]  EMERGENCY DEPARTMENT ENCOUNTER        Pt Name: Bryce Meyer  MRN: [id number]  Birthdate [birth year]  Date of evaluation: 11/25/2020  Provider: Fabiola BackerKristen S Flint Hakeem, APRN - CNP  PCP: No primary care provider on file.  Note Started: 7:14 PM EDT        I have seen and evaluated this patient with my supervising physician miller    CHIEF COMPLAINT       Chief Complaint   Patient presents with   ??? Chest Pain     Pt states that he feels like his heart is "slowing down" at night when he is at work. Pt states that this feeling isnt painful and that he is not feeling it now, but would like to be looked at.       HISTORY OF PRESENT ILLNESS   (Location, Timing/Onset, Context/Setting, Quality, Duration, Modifying Factors, Severity, Associated Signs and Symptoms)  Note limiting factors.     Chief Complaint: palpitations     Bryce GravelWilliam E Jr Meyer is a 60 y.o. male who presents to the emergency department complaining of palpitations.  The patient reports that he has been on third shift, working 5/12-hour night shifts per week and is only getting 2 to 3 hours of sleep during the day, states that his body is unable to adjust.  Reports that he has tried melatonin without success, stopped this medication.  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  Patient reports that he has intermittent discomfort to his chest but that the palpitations are generally not painful, only feels a "fluttering" sensation.  States that he was seen for this problem at [facility] sometime ago and did wear a cardiac monitor for several weeks, was told that he had nondangerous arrhythmia and was placed on metoprolol, states that his heart rate did drop "too low" and worsened symptoms, he stopped this medication.  Patient does not take any daily medications.    Reports that he has history of scleroderma, reports that this is remission and is doing much better.    Patient would  like to be evaluated for palpitations this evening, he is concerned that he may have a dangerous process or that this may be due to lack of rest.    Denies any headache, fever, lightheadedness, visual disturbances.  No neck or back pain.  No shortness of breath, cough, or congestion.  No abdominal pain, nausea, vomiting, diarrhea, constipation, or dysuria.  No rash.    Nursing Notes were all reviewed and agreed with or any disagreements were addressed in the HPI.    REVIEW OF SYSTEMS    (2-9 systems for level 4, 10 or more for level 5)     Review of Systems   Constitutional: Positive for fatigue. Negative for activity change, chills and fever.   Respiratory: Negative for chest tightness and shortness of breath.    Cardiovascular: Positive for chest pain and palpitations.   Gastrointestinal: Negative for abdominal pain, diarrhea, nausea and vomiting.   Genitourinary: Negative for dysuria.   Neurological: Positive for dizziness.   All other systems reviewed and are negative.      Positives and Pertinent negatives as per HPI. Except as noted above in the ROS, all other systems were reviewed and negative.       PAST MEDICAL HISTORY   History reviewed. No pertinent past medical history.      SURGICAL HISTORY  History reviewed. No pertinent surgical history.      CURRENTMEDICATIONS       There are no discharge medications for this patient.        ALLERGIES     Patient has no known allergies.    FAMILYHISTORY     History reviewed. No pertinent family history.       SOCIAL HISTORY       Social History     Tobacco Use   ??? Smoking status: Never Smoker   ??? Smokeless tobacco: Never Used   Substance Use Topics   ??? Alcohol use: Never   ??? Drug use: Never       SCREENINGS      Heart Score for chest pain patients  History: Slightly Suspicious  ECG: Non-Specifc repolarization disturbance/LBTB/PM  Patient Age: > 45 and < 65 years  *Risk factors for Atherosclerotic disease: Positive family History  Risk Factors: 1 or 2 risk  factors  Troponin: < 1X normal limit  Heart Score Total: 3      PHYSICAL EXAM    (up to 7 for level 4, 8 or more for level 5)     ED Triage Vitals [11/25/20 1901]   BP Temp Temp Source Pulse Resp SpO2 Height Weight   (!) 169/99 98.1 ??F (36.7 ??C) Oral 54 16 100 % 6' (1.829 m) 187 lb (84.8 kg)       Physical Exam  Vitals and nursing note reviewed.   Constitutional:       Appearance: He is well-developed. He is not diaphoretic.   HENT:      Head: Normocephalic and atraumatic.      Right Ear: External ear normal.      Left Ear: External ear normal.   Eyes:      General:         Right eye: No discharge.         Left eye: No discharge.   Neck:      Vascular: No JVD.   Cardiovascular:      Rate and Rhythm: Bradycardia present.      Pulses: Normal pulses.   Pulmonary:      Effort: Pulmonary effort is normal. No respiratory distress.      Breath sounds: Normal breath sounds.   Abdominal:      Palpations: Abdomen is soft.      Tenderness: There is no abdominal tenderness.   Musculoskeletal:         General: Normal range of motion.      Cervical back: Normal range of motion and neck supple.   Skin:     General: Skin is warm and dry.      Coloration: Skin is not pale.   Neurological:      Mental Status: He is alert and oriented to person, place, and time.   Psychiatric:         Behavior: Behavior normal.         DIAGNOSTIC RESULTS   LABS:    Labs Reviewed   COMPREHENSIVE METABOLIC PANEL - Abnormal; Notable for the following components:       Result Value    CO2 20 (*)     All other components within normal limits   CBC WITH AUTO DIFFERENTIAL   TROPONIN   TROPONIN       When ordered only abnormal lab results are displayed. All other labs were within normal range or not returned as of this dictation.    EKG: When  ordered, EKG's are interpreted by the Emergency Department Physician in the absence of a cardiologist.  Please see their note for interpretation of EKG.    RADIOLOGY:   Non-plain film images such as CT, Ultrasound and  MRI are read by the radiologist. Plain radiographic images are visualized and preliminarily interpreted by the ED Provider with the below findings:        Interpretation per the Radiologist below, if available at the time of this note:    XR CHEST PORTABLE   Final Result      1. No acute cardiopulmonary abnormality.           No results found.        PROCEDURES   Unless otherwise noted below, none     Procedures    CRITICAL CARE TIME       CONSULTS:  None      EMERGENCY DEPARTMENT COURSE and DIFFERENTIAL DIAGNOSIS/MDM:   Vitals:    Vitals:    11/25/20 2203 11/25/20 2218 11/25/20 2233 11/25/20 2303   BP: (!) 152/107  (!) 155/98 (!) 158/96   Pulse: 50 (!) 49 (!) 46 51   Resp: 16 15 15 15    Temp:       TempSrc:       SpO2: 99% 100% 100%    Weight:       Height:           Patient was given the following medications:  Medications - No data to display        Briefly, this is a 60 year old male with history of palpitations who presents to the emergency department tonight to be evaluated for palpitations and some intermittent chest pain.  Reports that he is asymptomatic at time of arrival but would like to be evaluated in case this is a dangerous process.  States that he is a patient of the 60 and has worn a cardiac monitor previously and was told that he had a nondangerous arrhythmia, was on metoprolol for some time but the medication was discontinued as it worsened his bradycardia.    Labs unremarkable including troponin and delta troponin.  EKG reviewed by attending physician, please see her interpretation.  Heart score between a 2 and 3.  He is pain-free this evening.  We did recommend follow-up with his team at the [facility].  Patient requesting work release, he is excused from work tonight, instructed to make follow-up appointment and return for any new or worsening symptoms    The patient has been evaluated and the history and physical exam suggest a benign etiology. I see nothing to suggest acute coronary syndrome,  myocardial infarction, pulmonary embolism, thoracic aortic dissection, significant pericarditis, pneumonia, pneumothorax, or acute abdomen.  I feel the patient can be safely discharged to home with outpatient follow up.  Instructions have been given for the patient to return to the Emergency Department for any worsening of the symptoms, including but not limited to increased pain, shortness of breath, abdominal pain or weakness.    FINAL IMPRESSION      1. Palpitations    2. RBBB    3. LAFB (left anterior fascicular block)          DISPOSITION/PLAN   DISPOSITION Decision To Discharge 11/25/2020 10:48:17 PM      PATIENT REFERRED TO:  your cardiologist    Call in 1 day      [facility] Emergency Department  [address]  [place]  [phone]  Go to   If symptoms worsen      DISCHARGE MEDICATIONS:  There are no discharge medications for this patient.      DISCONTINUED MEDICATIONS:  There are no discharge medications for this patient.             (Please note that portions of this note were completed with a voice recognition program.  Efforts were made to edit the dictations but occasionally words are mis-transcribed.)    Fabiola Backer, APRN - CNP (electronically signed)            Fabiola Backer, APRN - CNP  11/26/20 0007

## 2020-11-25 NOTE — ED Provider Notes (Signed)
[facility] Emergency Department      Pt Name: Bryce Meyer  MRN: [id number]  Birthdate 05/20/1961  Date of evaluation: 11/25/2020  Provider: Sidonie Dickens, MD  I independently performed a history and physical on Bryce Meyer.   All diagnostic, treatment, and disposition decisions were made by myself in conjunction with the advanced practice provider.     HPI: Bryce Meyer presented with   Chief Complaint   Patient presents with   ??? Chest Pain     Pt states that he feels like his heart is "slowing down" at night when he is at work. Pt states that this feeling isnt painful and that he is not feeling it now, but would like to be looked at.     Bryce Meyer has no past medical history on file.  He has no past surgical history on file.    No current facility-administered medications on file prior to encounter.     No current outpatient medications on file prior to encounter.     PHYSICAL EXAM  Vitals: BP (!) 169/99    Pulse 54    Temp 98.1 ??F (36.7 ??C) (Oral)    Resp 16    Ht 6' (1.829 m)    Wt 187 lb (84.8 kg)    SpO2 100%    BMI 25.36 kg/m??   Constitutional:  60 y.o. male alert  HENT:  Atraumatic, oral mucosa moist  Neck:  No visible JVD, supple  Chest/Lungs:  Respiratory effort normal, clear, regular  Abdomen:  Non-distended, soft, NT  Back:  No gross deformity  Extremities:  Normal tone and perfusion, no edema    Medical Decision Making and Plan: Briefly, this is an 60 y.o.male who presented with concern for palpitations that occur at night time while he is at work for a couple months.  Noted episode today while he was laying down.  Denies that it was pain episode.  He has had evaluation through [facility] which included a cardiac monitor that he wore for period of time.  He was started on beta-blocker.      I informed him of abnormal conduction intervals on his EKG.  Unfortunately, cannot compare with old since his other records are at the [facility] hospital.  We did two sets of delayed cardiac  markers, both of which are negative.  He was in a SR with mild bradycardia while monitored in the ED.  HR above 50 throughout.      The patient's history and evaluation suggests a less emergent etiology for the discomfort.  We do not believe the patient is experiencing symptoms from acute coronary syndrome, aortic dissection, pulmonary embolism, pneumothorax, myocarditis, Boerhaave syndrome, pericardial tamponade, acute abdomen, amongst other emergencies.     Bryce Meyer was given appropriate discharge instructions.  Referral to follow up provider.     For further details of Bryce Meyer [facility] Emergency Department encounter, please see documentation by advanced practice provider Fay Records, CNP.     Labs Reviewed   COMPREHENSIVE METABOLIC PANEL - Abnormal; Notable for the following components:       Result Value    CO2 20 (*)     All other components within normal limits   CBC WITH AUTO DIFFERENTIAL   TROPONIN   TROPONIN     RADIOLOGY:     Plain x-rays were viewed by me:   XR CHEST PORTABLE   Final Result  1. No acute cardiopulmonary abnormality.           EKG:  Read by me in the absence of a cardiologist shows: Sinus bradycardia, rate 52, first-degree AV block, right bundle branch block, left anterior fascicular block, left axis, nonspecific ST-T wave abnormality, prior EKG not available, LVH    FOLLOW UP:    your cardiologist    Call in 1 day      [facility] Emergency Department  [address]  [place] [address]  [phone]  Go to   If symptoms worsen    FINAL IMPRESSION:    1. Palpitations    2. RBBB    3. LAFB (left anterior fascicular block)       DISPOSITION Decision To Discharge 11/25/2020 10:48:17 PM          Bryce Meyer Al Pimple, MD  11/25/20 2256

## 2020-11-25 NOTE — ED Notes (Signed)
Delta trop drawn and sent      Raford Pitcher, RN  11/25/20 2202

## 2020-11-26 LAB — EKG 12-LEAD
Atrial Rate: 52 {beats}/min
P Axis: 14 degrees
P-R Interval: 242 ms
Q-T Interval: 436 ms
QRS Duration: 126 ms
QTc Calculation (Bazett): 405 ms
R Axis: -74 degrees
T Axis: -40 degrees
Ventricular Rate: 52 {beats}/min

## 2020-11-26 LAB — TROPONIN: Troponin: <0.01 ng/mL (ref <0.01)

## 2024-06-20 ENCOUNTER — Inpatient Hospital Stay: Admit: 2024-06-20

## 2024-06-22 ENCOUNTER — Inpatient Hospital Stay: Admit: 2024-06-22 | Attending: Family

## 2024-06-22 ENCOUNTER — Ambulatory Visit: Admit: 2024-06-22 | Attending: Family

## 2024-06-22 NOTE — Patient Instructions (Signed)
 An After Visit Summary was printed and given to the patient.    X-rays have been ordered. Complete today.    A referral has been placed to [facility]. They will reach out to you to schedule.    2 weeks after injection is completed and you are not having pain relief please call our office to schedule a new patient visit with Dr. Robby

## 2024-06-22 NOTE — Progress Notes (Signed)
 Referring Physician  Amy Lanell     Requested Procedure  ESI C7-T1 per AJ    Implanted device/Spine Hardware  Bilateral hips replaced.     Medications  Advil 3 day hold prior. Last dose on 12/12.    Patient denied taking any other medications than what were reviewed in this  encounter    Patient Prepped for an ESI on 12/16 @ 2:30 pm/arrival 2 pm  Agree's to a driver Kela) and NPO after 12 (noon)     Pre cert Approved, ESI C7-T1 per YASMIN CAL, [place], Approval [phone], Valid 10/28-11/5/26

## 2024-06-22 NOTE — Progress Notes (Signed)
 Subjective      Patient ID: Troy Jennings. is a 63 y.o. male.  Chief Complaint   Patient presents with    New Patient Visit/ Consultation       HPI   Troy Jennings is a 63 yr old male who presents for initial evaluation of cervical and left arm pain. The pain began  2 months. He has issues with opening jars and dropping objects. Pain radiates to his left hand. He denies balance issues or falls. His pain is constant. No cervical ESI in the past. He has worked with PT with short term relief. He  has taken robaxin without relief. He denies incontinence of bowel or bladder.       Histories:     He has no past medical history on file.    He has no past surgical history on file.    His family history is not on file.    He reports that he has never smoked. He has never used smokeless tobacco. He reports that he does not drink alcohol and does not use drugs.      Review of Systems    Allergies:   Primaquine    Medications:   Encounter Medications[1]    Objective   Blood pressure 140/87, pulse 56, temperature 97 F (36.1 C), temperature source Temporal, resp. rate 18, height 6' (1.829 m), weight 182 lb 12.8 oz (82.9 kg), SpO2 99%.  Neurological Exam  Mental Status  Awake, alert and oriented to person, place and time.    Motor  Normal muscle bulk throughout. Strength is 5/5 throughout all four extremities.    Sensory  Sensation is intact to light touch, pinprick, vibration and proprioception in all four extremities.    Gait  Normal casual, toe, heel and tandem gait.    Physical Exam  Neurological:      Motor: Motor strength is normal.         Assessment & Plan   Troy Jennings is a 63 yr old male who presents for initial evaluation of cervical and left arm pain.      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     Plan:  - Xray cervical spine with flex ext  - Referral for cervical ESI  - Follow up with Dr. Robby if not improved after ESI     Today's body mass  index is 24.79 kg/m. BMI is normal.          [1]   No outpatient encounter medications on file as of 06/22/2024.     No facility-administered encounter medications on file as of 06/22/2024.

## 2024-07-26 ENCOUNTER — Ambulatory Visit: Attending: Diagnostic Radiology

## 2024-07-26 NOTE — Telephone Encounter (Signed)
 Called patient in an attempt to reschedule ESI that he did not show for today. Spoke to patient and he said he tried to cancel it in mychart, but it would not let him. Rescheduled for 09/13/24 @ 2 pm with arrival 1:30 pm.  Agree's to a driver Kela) and NPO after 11:30 am. Knows to hold Advil 3 days prior. Patient verbalized understanding.

## 2024-09-13 ENCOUNTER — Encounter: Attending: Diagnostic Radiology
